# Patient Record
Sex: Male | Born: 1995 | Race: Black or African American | Hispanic: No | Marital: Single | Smoking: Never smoker
Health system: Southern US, Community
[De-identification: ages and names within clinical notes are randomized; demographics above are authoritative.]

## PROBLEM LIST (undated history)

## (undated) HISTORY — PX: FRACTURE SURGERY: SHX138

---
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
## 2016-02-23 ENCOUNTER — Ambulatory Visit
Admission: EM | Admit: 2016-02-23 | Discharge: 2016-02-23 | Disposition: A | Discharge status: Home / Self Care | Payer: BLUE CROSS/BLUE SHIELD | Attending: Family Medicine | Admitting: Family Medicine

## 2016-02-23 ENCOUNTER — Encounter: Payer: Self-pay | Admitting: Emergency Medicine

## 2016-02-23 DIAGNOSIS — W57XXXA Bitten or stung by nonvenomous insect and other nonvenomous arthropods, initial encounter: Secondary | ICD-10-CM

## 2016-02-23 DIAGNOSIS — S90562A Insect bite (nonvenomous), left ankle, initial encounter: Secondary | ICD-10-CM | POA: Diagnosis not present

## 2016-02-23 MED ORDER — LORATADINE 10 MG PO TABS: 10.0000 mg | ORAL_TABLET | Freq: Every day | ORAL | Status: AC

## 2016-02-23 MED ORDER — PREDNISONE 20 MG PO TABS: 40.0000 mg | ORAL_TABLET | Freq: Every day | ORAL | Status: AC

## 2016-02-23 NOTE — ED Notes (Signed)
Patient c/o insect bite to his left ankle two days ago.  Patient reports itching.  Patient denies fevers.

## 2016-02-23 NOTE — Discharge Instructions (Signed)
Take medication as prescribed. Rest. Drink plenty of fluids. Apply ice and elevate. Avoid scratching.    Follow up with your primary care physician this week as needed. Return to Urgent care for new or worsening concerns.    Insect Bite Mosquitoes, flies, fleas, bedbugs, and many other insects can bite. Insect bites are different from insect stings. A sting is when poison (venom) is injected into the skin. Insect bites can cause pain or itching for a few days, but they are usually not serious. Some insects can spread diseases to people through a bite. SYMPTOMS  Symptoms of an insect bite include:  Itching or pain in the bite area.  Redness and swelling in the bite area.  An open wound (skin ulcer). In many cases, symptoms last for 2-4 days.  DIAGNOSIS  This condition is usually diagnosed based on symptoms and a physical exam. TREATMENT  Treatment is usually not needed for an insect bite. Symptoms often go away on their own. Your health care provider may recommend creams or lotions to help reduce itching. Antibiotic medicines may be prescribed if the bite becomes infected. A tetanus shot may be given in some cases. If you develop an allergic reaction to an insect bite, your health care provider will prescribe medicines to treat the reaction (antihistamines). This is rare. HOME CARE INSTRUCTIONS  Do not scratch the bite area.  Keep the bite area clean and dry. Wash the bite area daily with soap and water as told by your health care provider.  If directed, applyice to the bite area.  Put ice in a plastic bag.  Place a towel between your skin and the bag.  Leave the ice on for 20 minutes, 2-3 times per day.  To help reduce itching and swelling, try applying a baking soda paste, cortisone cream, or calamine lotion to the bite area as told by your health care provider.  Apply or take over-the-counter and prescription medicines only as told by your health care provider.  If you were  prescribed an antibiotic medicine, use it as told by your health care provider. Do not stop using the antibiotic even if your condition improves.  Keep all follow-up visits as told by your health care provider. This is important. PREVENTION   Use insect repellent. The best insect repellents contain:  DEET, picaridin, oil of lemon eucalyptus (OLE), or IR3535.  Higher amounts of an active ingredient.  When you are outdoors, wear clothing that covers your arms and legs.  Avoid opening windows that do not have window screens. SEEK MEDICAL CARE IF:  You have increased redness, swelling, or pain in the bite area.  You have a fever. SEEK IMMEDIATE MEDICAL CARE IF:   You have joint pain.   You have fluid, blood, or pus coming from the bite area.  You have a headache or neck pain.  You have unusual weakness.  You have a rash.  You have chest pain or shortness of breath.  You have abdominal pain, nausea, or vomiting.  You feel unusually tired or sleepy.   This information is not intended to replace advice given to you by your health care provider. Make sure you discuss any questions you have with your health care provider.   Document Released: 10/03/2004 Document Revised: 05/17/2015 Document Reviewed: 01/11/2015 Elsevier Interactive Patient Education [employer]2016 [employer]

## 2016-02-23 NOTE — ED Provider Notes (Signed)
[facility]  ____________________________________________  Time seen: Approximately 2:03 PM  I have reviewed the triage vital signs and the nursing notes.   HISTORY  Chief Complaint Insect Bite   HPI Andre Crawford is a 20 y.o. male presents for complaint of insect bite to left ankle from 2 days ago. Patient reports the area is itchy but denies pain. Patient states that he was wearing ankle socks while he was on hiring a baseball game and states that he noticed he was scratching the area during the game and states he feels like he was bitten by insect during the game. Denies any tick bites, tick exposures or tick attached. Denies rash. States that the area is itchy and he is scratching and now the area is somewhat swollen. Denies any pain with range of motion or with weightbearing. States this is not painful. Patient reports he did take 1 dose of Benadryl at home yesterday which helped mildly and states that the topical Caladryl lotion does also help.  Denies fall, trauma, other rash or skin changes, redness, drainage, pain radiation, numbness or tingling sensation, decreased range of motion, other skin changes or other insect bites. Patient reports that he noticed a small insect bite around the itchy area.   Denies fevers, headaches, myalgias, neck pain, back pain, other swelling, recent sickness, vision changes, recent antibiotic use, cough, congestion, chest pain or shortness breath.   History reviewed. No pertinent past medical history.  There are no active problems to display for this patient.   Past Surgical History  Procedure Laterality Date  . Fracture surgery      Current Outpatient Rx  Name  Route  Sig  Dispense  Refill  .           Marland Kitchen             Allergies Review of patient's allergies indicates no known allergies.  History reviewed. No pertinent family history.  Social History Social History  Substance Use Topics  . Smoking status: Never Smoker   .  Smokeless tobacco: Never Used  . Alcohol Use: No    Review of Systems Constitutional: No fever/chills Eyes: No visual changes. ENT: No sore throat. Cardiovascular: Denies chest pain. Respiratory: Denies shortness of breath. Gastrointestinal: No abdominal pain.  No nausea, no vomiting.  No diarrhea.  No constipation. Genitourinary: Negative for dysuria. Musculoskeletal: Negative for back pain. Skin: Negative for rash. As above.  Neurological: Negative for headaches, focal weakness or numbness.  10-point ROS otherwise negative.  ____________________________________________   PHYSICAL EXAM:  VITAL SIGNS: ED Triage Vitals  Enc Vitals Group     BP 02/23/16 1345 152/81 mmHg     Pulse Rate 02/23/16 1345 95     Resp 02/23/16 1345 16     Temp 02/23/16 1345 98.2 F (36.8 C)     Temp Source 02/23/16 1345 Oral     SpO2 02/23/16 1345 100 %     Weight 02/23/16 1345 173 lb (78.472 kg)     Height 02/23/16 1345  (1.778 m)     Head Cir --      Peak Flow --      Pain Score 02/23/16 1344 0     Pain Loc --      Pain Edu? --      Excl. in GC? --     Constitutional: Alert and oriented. Well appearing and in no acute distress. Eyes: Conjunctivae are normal. PERRL. EOMI. Head: Atraumatic.  Ears: normal external  appearance bilaterally.   Nose: No congestion/rhinnorhea.  Mouth/Throat: Mucous membranes are moist.  Neck: No stridor.  No cervical spine tenderness to palpation. Cardiovascular: Normal rate, regular rhythm. Grossly normal heart sounds.  Good peripheral circulation. Respiratory: Normal respiratory effort.  No retractions. Lungs CTAB. Gastrointestinal: Soft and nontender. Musculoskeletal: No lower or upper extremity tenderness nor edema.  Bilateral pedal pulses equal and easily palpated.  Neurologic:  Normal speech and language. No gross focal neurologic deficits are appreciated. No gait instability. Skin:  Skin is warm, dry and intact. No rash noted. Except: Left anterior  to the lateral ankle less than 0.25 cm papule with centered punctum, and no erythema, no surrounding erythema, nontender, mild soft tissue swelling, no bony tenderness, full range of motion, bilateral pedal pulses equal and easily palpable, full range of motion. No other rash, insect bite or lesion noted. No foreign bodies visualized. Psychiatric: Mood and affect are normal. Speech and behavior are normal.  ____________________________________________   LABS (all labs ordered are listed, but only abnormal results are displayed)  Labs Reviewed - No data to display  INITIAL IMPRESSION / ASSESSMENT AND PLAN / ED COURSE  Pertinent labs & imaging results that were available during my care of the patient were reviewed by me and considered in my medical decision making (see chart for details).  Very well-appearing patient. No acute distress. Presents for the evaluation of left ankle itching in which he reports was bitten by insect while umpiring a game 2 days ago. Denies pain or trauma but reports the area is itchy. Denies tick bites and denies wanting tick labs evaluated, declines tick labs evaluation. Left anterior to the lateral ankle less than 0.25 cm lesion with centered punctum, and no erythema, no surrounding erythema, nontender, mild soft tissue swelling, no bony tenderness. Suspect local reaction from insect bite. Will treat patient with oral when necessary Benadryl or Claritin and 3 days of 40 mg oral prednisone. No signs of bacterial infection. Encouraged avoidance of scratching, ice and elevation. Discussed indication, risks and benefits of medications with patient.   Discussed follow up with Primary care physician this week. Discussed follow up and return parameters including no resolution or any worsening concerns. Patient and mother verbalized understanding and agreed to plan.   ____________________________________________   FINAL CLINICAL IMPRESSION(S) / ED DIAGNOSES  Final  diagnoses:  Insect bite of left ankle with local reaction, initial encounter     Discharge Medication List as of 02/23/2016  2:06 PM    START taking these medications   Details  loratadine (CLARITIN) 10 MG tablet Take 1 tablet (10 mg total) by mouth daily., Starting 02/23/2016, Until Discontinued, Normal    predniSONE (DELTASONE) 20 MG tablet Take 2 tablets (40 mg total) by mouth daily., Starting 02/23/2016, Until Mon 02/26/16, Normal        Note: This dictation was prepared with Dragon dictation along with smaller phrase technology. Any transcriptional errors that result from this process are unintentional.       Renford DillsLindsey Malakhi Markwood, NP 02/23/16 1913
